# Patient Record
Sex: Male | Born: 1955
Health system: Midwestern US, Community
[De-identification: ages and names within clinical notes are randomized; demographics above are authoritative.]

## PROBLEM LIST (undated history)

## (undated) DIAGNOSIS — I10 Essential (primary) hypertension: Secondary | ICD-10-CM

## (undated) DIAGNOSIS — S069X9A Unspecified intracranial injury with loss of consciousness of unspecified duration, initial encounter: Secondary | ICD-10-CM

## (undated) DIAGNOSIS — R569 Unspecified convulsions: Secondary | ICD-10-CM

## (undated) HISTORY — PX: BRAIN SURGERY: SHX531

## (undated) HISTORY — PX: FRACTURE SURGERY: SHX138

## (undated) HISTORY — PX: JOINT REPLACEMENT: SHX530

## (undated) HISTORY — PX: BACK SURGERY: SHX140

---

## 1995-01-09 DIAGNOSIS — S069X9A Unspecified intracranial injury with loss of consciousness of unspecified duration, initial encounter: Secondary | ICD-10-CM

## 1995-01-09 DIAGNOSIS — S069XAA Unspecified intracranial injury with loss of consciousness status unknown, initial encounter: Secondary | ICD-10-CM

## 1995-01-09 HISTORY — DX: Unspecified intracranial injury with loss of consciousness status unknown, initial encounter: S06.9XAA

## 1995-01-09 HISTORY — DX: Unspecified intracranial injury with loss of consciousness of unspecified duration, initial encounter: S06.9X9A

## 2017-03-08 ENCOUNTER — Encounter (HOSPITAL_COMMUNITY): Payer: Self-pay | Admitting: Emergency Medicine

## 2017-03-08 ENCOUNTER — Emergency Department (HOSPITAL_COMMUNITY)
Admission: EM | Admit: 2017-03-08 | Discharge: 2017-03-08 | Disposition: A | Discharge status: Home / Self Care | Payer: Self-pay | Attending: Emergency Medicine | Admitting: Emergency Medicine

## 2017-03-08 ENCOUNTER — Emergency Department (HOSPITAL_COMMUNITY): Payer: Self-pay

## 2017-03-08 DIAGNOSIS — I10 Essential (primary) hypertension: Secondary | ICD-10-CM | POA: Insufficient documentation

## 2017-03-08 DIAGNOSIS — Y998 Other external cause status: Secondary | ICD-10-CM | POA: Insufficient documentation

## 2017-03-08 DIAGNOSIS — W010XXA Fall on same level from slipping, tripping and stumbling without subsequent striking against object, initial encounter: Secondary | ICD-10-CM | POA: Insufficient documentation

## 2017-03-08 DIAGNOSIS — S46912A Strain of unspecified muscle, fascia and tendon at shoulder and upper arm level, left arm, initial encounter: Secondary | ICD-10-CM | POA: Insufficient documentation

## 2017-03-08 DIAGNOSIS — W19XXXA Unspecified fall, initial encounter: Secondary | ICD-10-CM

## 2017-03-08 DIAGNOSIS — S0990XA Unspecified injury of head, initial encounter: Secondary | ICD-10-CM | POA: Insufficient documentation

## 2017-03-08 DIAGNOSIS — Y9259 Other trade areas as the place of occurrence of the external cause: Secondary | ICD-10-CM | POA: Insufficient documentation

## 2017-03-08 DIAGNOSIS — Y9389 Activity, other specified: Secondary | ICD-10-CM | POA: Insufficient documentation

## 2017-03-08 HISTORY — DX: Unspecified intracranial injury with loss of consciousness of unspecified duration, initial encounter: S06.9X9A

## 2017-03-08 HISTORY — DX: Essential (primary) hypertension: I10

## 2017-03-08 HISTORY — DX: Unspecified convulsions: R56.9

## 2017-03-08 MED ORDER — ACETAMINOPHEN 325 MG PO TABS: 650.0000 mg | ORAL_TABLET | Freq: Once | ORAL | Status: DC

## 2017-03-08 NOTE — Discharge Instructions (Signed)
Follow up with your doctor when you return home. Tylenol for pain as needed.
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
## 2017-03-08 NOTE — ED Notes (Signed)
Pt ambulated in hallway without assistance

## 2017-03-08 NOTE — ED Notes (Signed)
Pt refused to let this writer put the pulse ox on him while he ambulated in hallway. Pt walked out of the EMS bay stating this is the worse care and he was left in a room that was like a sauna to sweat to death.

## 2017-03-08 NOTE — ED Notes (Signed)
Pt. Ambulated from room to hallway without difficulty. Pt. Wanted to be discharged now. Pt. Refused for V/S taking.Pt. Refused to wait for discharge papers nor pain medicine as ordered . Pt. Refused to sign discharge pad. PA notified. Pt. Assisted to exit at  Lobby/waiting room for his ride but pt. Refused and walked out of department thru EMS bay. Engineer, materialsecurity Officer was assisting pt.  to come back inside and offered to take pt. To the lobby , informed that we had off duty [employer] officer in the Dept. As pt. Was saying that he will call 911/police to give him a ride back [place] [facility]. Pt. Preferred to stay at the EMS bay and refused the assistant . Pt. Alert and oriented x3. On his cell phone .

## 2017-03-08 NOTE — ED Notes (Signed)
Patient transported to X-ray 

## 2019-05-07 IMAGING — CT CT CERVICAL SPINE W/O CM
4 of 7 series · 14 of 33 positions shown, 15 images · non-contrast
Comparison: None.

CLINICAL DATA: 61-year-old male with head trauma.

EXAM:
CT HEAD WITHOUT CONTRAST
CT CERVICAL SPINE WITHOUT CONTRAST
TECHNIQUE: Multidetector CT imaging of the head and cervical spine was
performed following the standard protocol without intravenous
contrast. Multiplanar CT image reconstructions of the cervical spine
were also generated.

[Series 8: c spine soft · axial · 0.27mm/px · z∈[-195,-75]mm · 4 of 101 slices shown]
[im 21/101  soft-tissue]
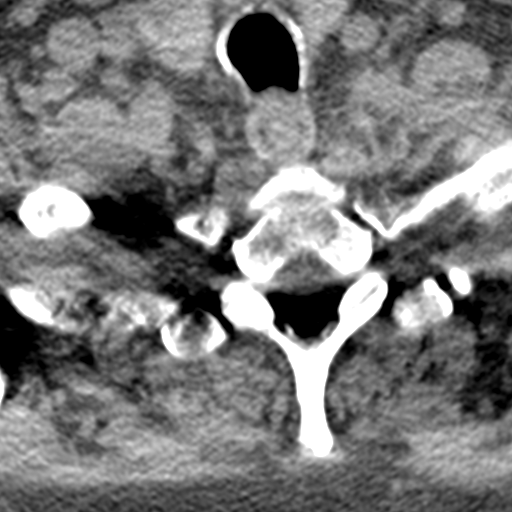
[im 41/101  soft-tissue]
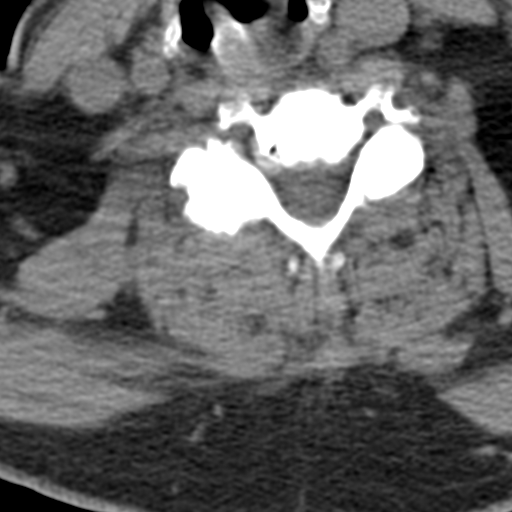
[im 61/101  soft-tissue]
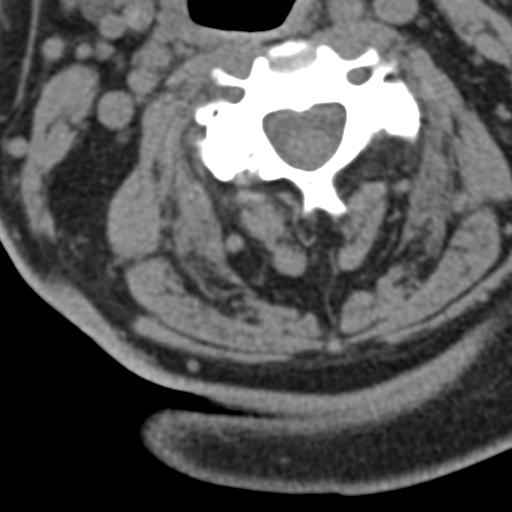
[im 81/101  soft-tissue]
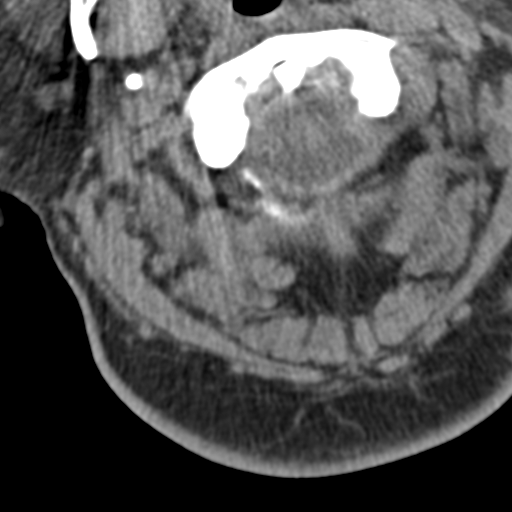

[Series 9: orthogonal bone · axial · 0.23mm/px · z∈[-219,-104]mm · 4 of 104 slices shown, 5 images]
[im 21/104  soft-tissue]
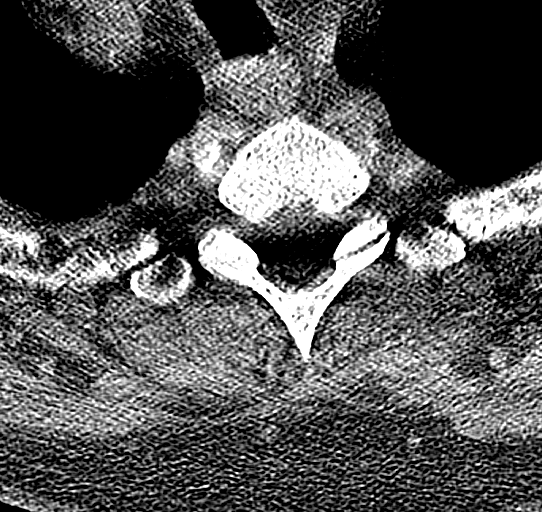
[im 21/104  bone]
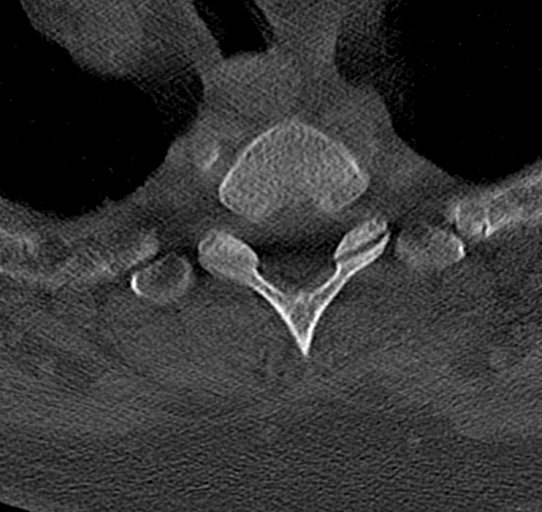
[im 42/104  bone]
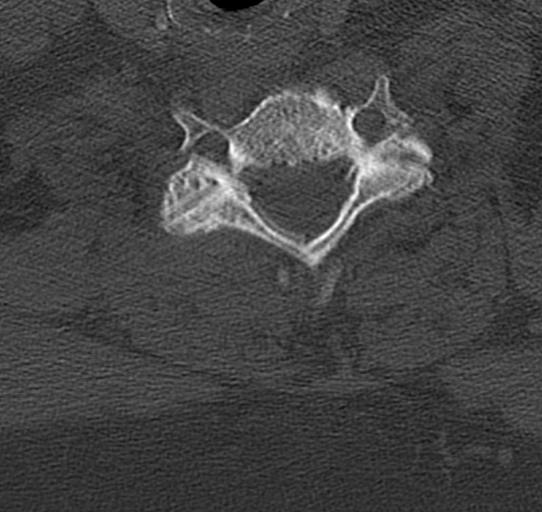
[im 62/104  bone]
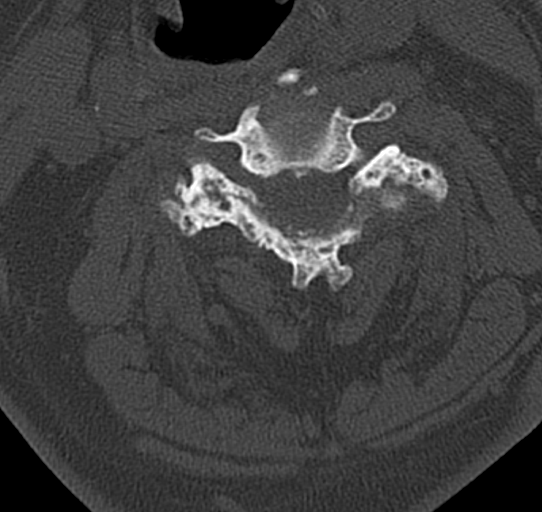
[im 83/104  bone]
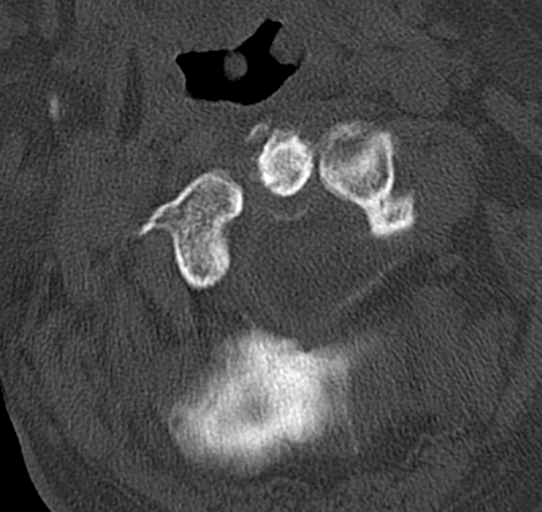

[Series 10: coronal bone · coronal · 0.24mm/px · 1 of 61 slices shown]
[im 31/61  bone]
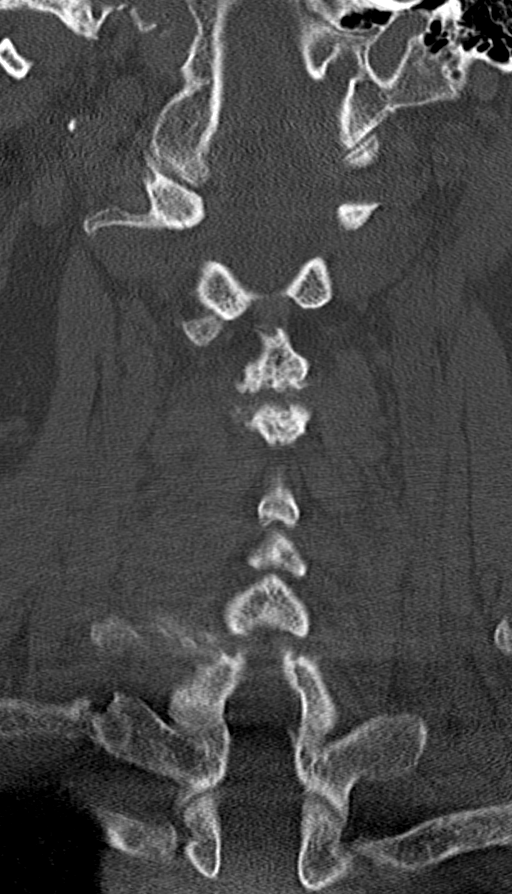

[Series 11: sagittal bone · sagittal · 0.23mm/px · 5 of 61 slices shown]
[im 11/61  bone]
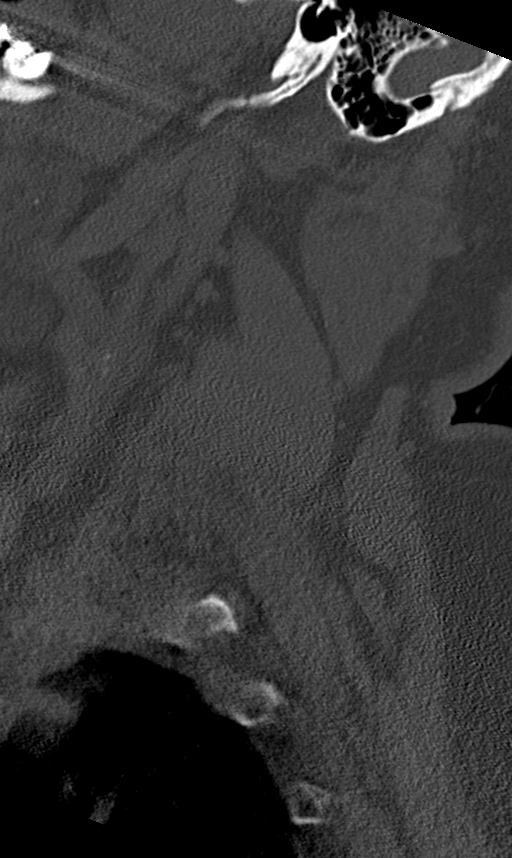
[im 21/61  bone]
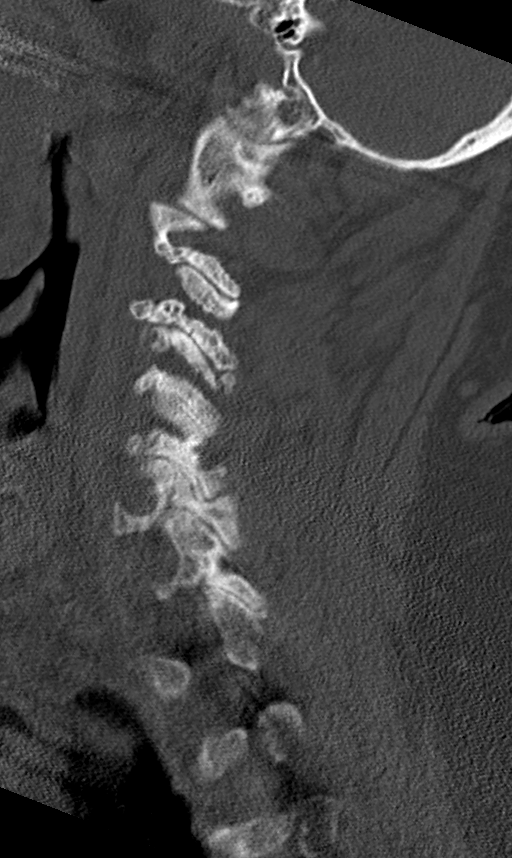
[im 31/61  bone]
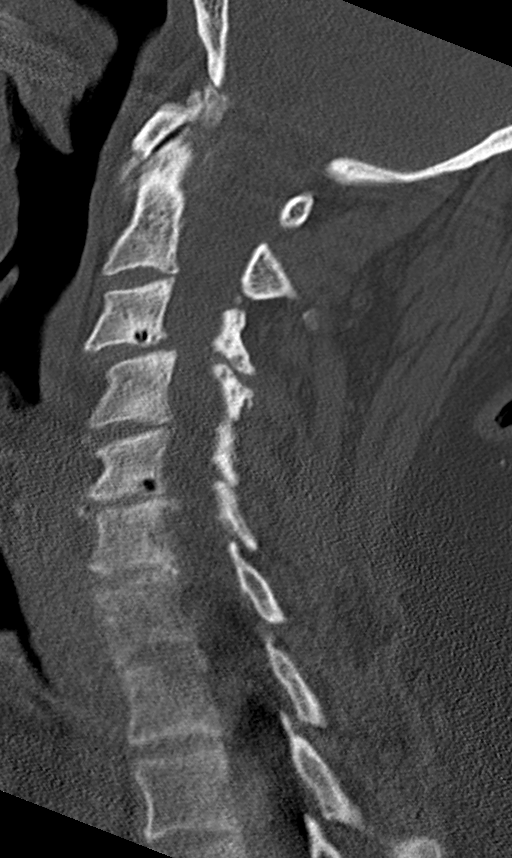
[im 41/61  bone]
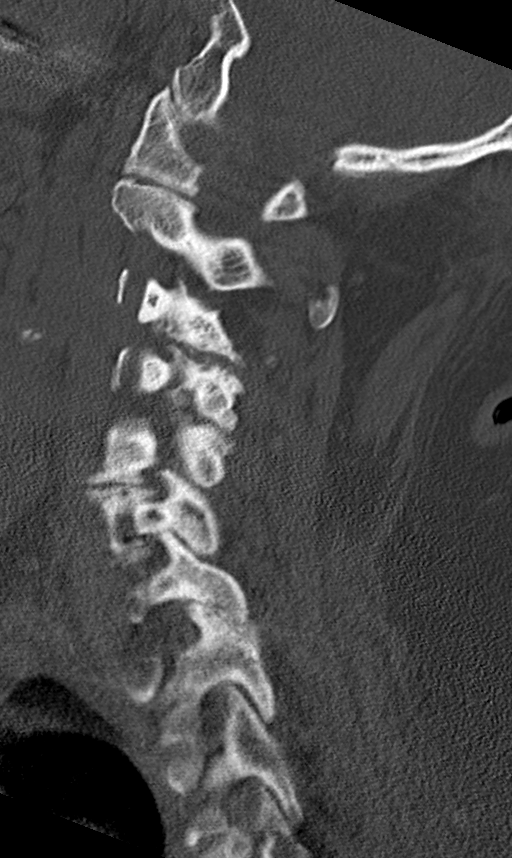
[im 51/61  bone]
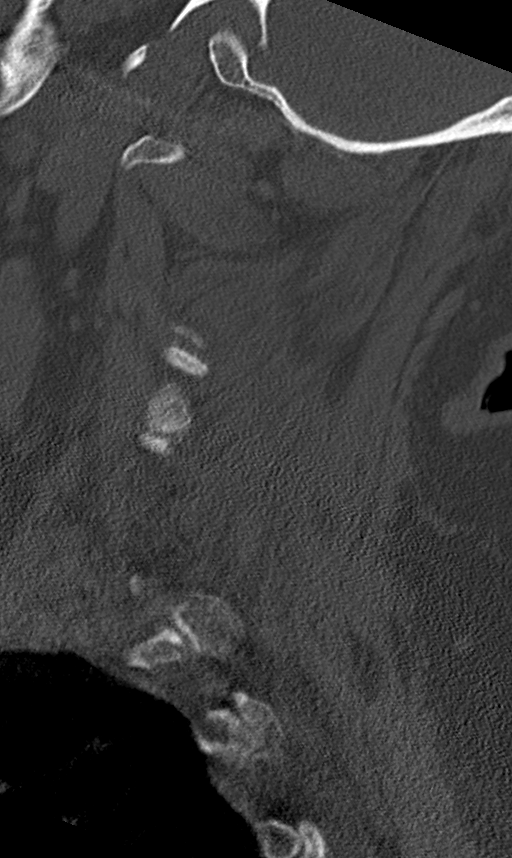

[14 of 33 positions shown; findings below may reference images not displayed]

FINDINGS: CT HEAD FINDINGS

Brain: The ventricles and sulci appropriate size for patient's age.
Minimal periventricular and deep white matter chronic microvascular
ischemic changes noted. There is no acute intracranial hemorrhage.
No mass effect or midline shift. No extra-axial fluid collection.

Vascular: No hyperdense vessel or unexpected calcification.

Skull: Normal. Negative for fracture or focal lesion.

Sinuses/Orbits: The visualized paranasal sinuses and mastoid air
cells are clear.

Other: None

CT CERVICAL SPINE FINDINGS

Alignment: No acute subluxation.  Grade 1 C3-C4 anterolisthesis.

Skull base and vertebrae: No acute fracture.

Soft tissues and spinal canal: No prevertebral fluid or swelling. No
visible canal hematoma.

Disc levels: Multilevel degenerative changes. Multilevel facet
hypertrophy.

Upper chest: Negative.

Other: None
IMPRESSION: 1. No acute intracranial pathology.
2. No acute/traumatic cervical spine pathology. Multilevel
degenerative changes.

## 2019-08-12 NOTE — Telephone Encounter (Signed)
Left message to schedule a VV with Dr. Sharlet Salina for sleep apnea.
